# Patient Record
Sex: Male | Born: 1980 | Race: White | Hispanic: Yes | Marital: Married | Smoking: Never smoker
Health system: Southern US, Community
[De-identification: ages and names within clinical notes are randomized; demographics above are authoritative.]

## PROBLEM LIST (undated history)

## (undated) HISTORY — PX: SHOULDER ACROMIOPLASTY: SHX6093

---

## 2015-10-21 ENCOUNTER — Ambulatory Visit (INDEPENDENT_AMBULATORY_CARE_PROVIDER_SITE_OTHER): Payer: 59 | Admitting: Physician Assistant

## 2015-10-21 ENCOUNTER — Ambulatory Visit (INDEPENDENT_AMBULATORY_CARE_PROVIDER_SITE_OTHER): Payer: 59

## 2015-10-21 VITALS — BP 126/80 | HR 70 | Temp 98.0°F | Resp 16 | Ht 63.0 in | Wt 186.2 lb

## 2015-10-21 DIAGNOSIS — S63641A Sprain of metacarpophalangeal joint of right thumb, initial encounter: Secondary | ICD-10-CM

## 2015-10-21 DIAGNOSIS — S5331XA Traumatic rupture of right ulnar collateral ligament, initial encounter: Secondary | ICD-10-CM

## 2015-10-21 DIAGNOSIS — M79641 Pain in right hand: Secondary | ICD-10-CM

## 2015-10-21 MED ORDER — IBUPROFEN 600 MG PO TABS: 600.0000 mg | ORAL_TABLET | Freq: Three times a day (TID) | ORAL | Status: AC | PRN

## 2015-10-21 NOTE — Progress Notes (Signed)
   Subjective:    Patient ID: Eugene Nelson, male    DOB: 02/24/1981, 34 y.o.   MRN: [id number]  HPI Patient presents for right hand pain following injury 2 days ago with machine used to dig holes. Machine twisted and caused thumb to be pushed back. Pain localized between thumb and pointer finger and does not radiate. Denies redness, swelling, or warmth. ROM limited only due to pain, but denies loss of sensation or function. No previous injury to hand. Med allergy amoxicillin.   Review of Systems As noted above.    Objective:   Physical Exam  Constitutional: He is oriented to person, place, and time. He appears well-developed and well-nourished. No distress.  Blood pressure 126/80, pulse 70, temperature 98 F (36.7 C), temperature source Oral, resp. rate 16, height 5\' 3"  (1.6 m), weight 186 lb 3.2 oz (84.46 kg), SpO2 98 %.  HENT:  Head: Normocephalic and atraumatic.  Right Ear: External ear normal.  Left Ear: External ear normal.  Eyes: Conjunctivae are normal. Right eye exhibits no discharge. Left eye exhibits no discharge. No scleral icterus.  Pulmonary/Chest: Effort normal.  Musculoskeletal:       Right wrist: Normal.       Right hand: He exhibits decreased range of motion (secondary to pain) and tenderness. He exhibits no bony tenderness, normal two-point discrimination, normal capillary refill, no deformity, no laceration and no swelling. Normal sensation noted. Decreased strength noted. He exhibits thumb/finger opposition (thumb and pointer finger). He exhibits no finger abduction and no wrist extension trouble.       Hands: Laxity of ulnar collateral ligament of right hand in comparison to left hand.  Neurological: He is alert and oriented to person, place, and time. He has normal reflexes. No cranial nerve deficit. He exhibits normal muscle tone. Coordination normal.  Skin: Skin is warm. No rash noted. He is not diaphoretic. No erythema. No pallor.  Psychiatric: He has a normal  mood and affect. His behavior is normal. Judgment and thought content normal.    [facility] reading (PRIMARY) by  Dr. Dareen PianoAnderson. No acute bony abnomalities.     Assessment & Plan:  1. Pain of right hand 2. Gamekeeper's thumb of right hand, initial encounter RTC in 2 weeks for follow up. Ice and rest. - DG Hand Complete Right; Future - ibuprofen (ADVIL,MOTRIN) 600 MG tablet; Take 1 tablet (600 mg total) by mouth every 8 (eight) hours as needed.  Dispense: 30 tablet; Refill: 0   Aprile Dickenson PA-C  [facility] 10/21/2015 11:52 AM

## 2015-10-21 NOTE — Progress Notes (Signed)
  Medical screening examination/treatment/procedure(s) were performed by non-physician practitioner and as supervising physician I was immediately available for consultation/collaboration.     

## 2015-10-21 NOTE — Patient Instructions (Signed)
Gamekeeper's thumb (or skier's thumb) derives its name from court gamekeepers who developed chronic degeneration of the ulnar collateral ligament of the metacarpophalangeal (MCP) joint when twisting the necks of fowl and other game caught while hunting.  MECHANISM OF INJURY - Forced abduction and hyperextension of the metacarpophalangeal (MCP) joint is the usual mechanism for ulnar collateral ligament (UCL) injury. This can occur if someone falls onto their thumb or the thumb is struck, violently forcing it into abduction.  Acute therapy, splinting, and rehabilitation - Ice should be applied acutely to the metacarpophalangeal (MCP) joint. The nonsurgical treatment of choice is immobilization with a thumb spica splint. The thumb must remain completely rested and protected for at least three weeks, after which gentle passive range of motion exercises may be performed out of the splint along with isometric strengthening of thumb flexion and intrinsic muscles of the hand (pinch and handgrip).

## 2016-12-12 IMAGING — CR DG HAND COMPLETE 3+V*R*
3 series · 3 of 3 positions shown · non-contrast
Comparison: None.

CLINICAL DATA: Right hand pain since an injury 2 days ago while
taking holes with a machine resulted hyperextension of the right
thumb. Initial encounter.

EXAM:
RIGHT HAND - COMPLETE 3+ VIEW

[PA]
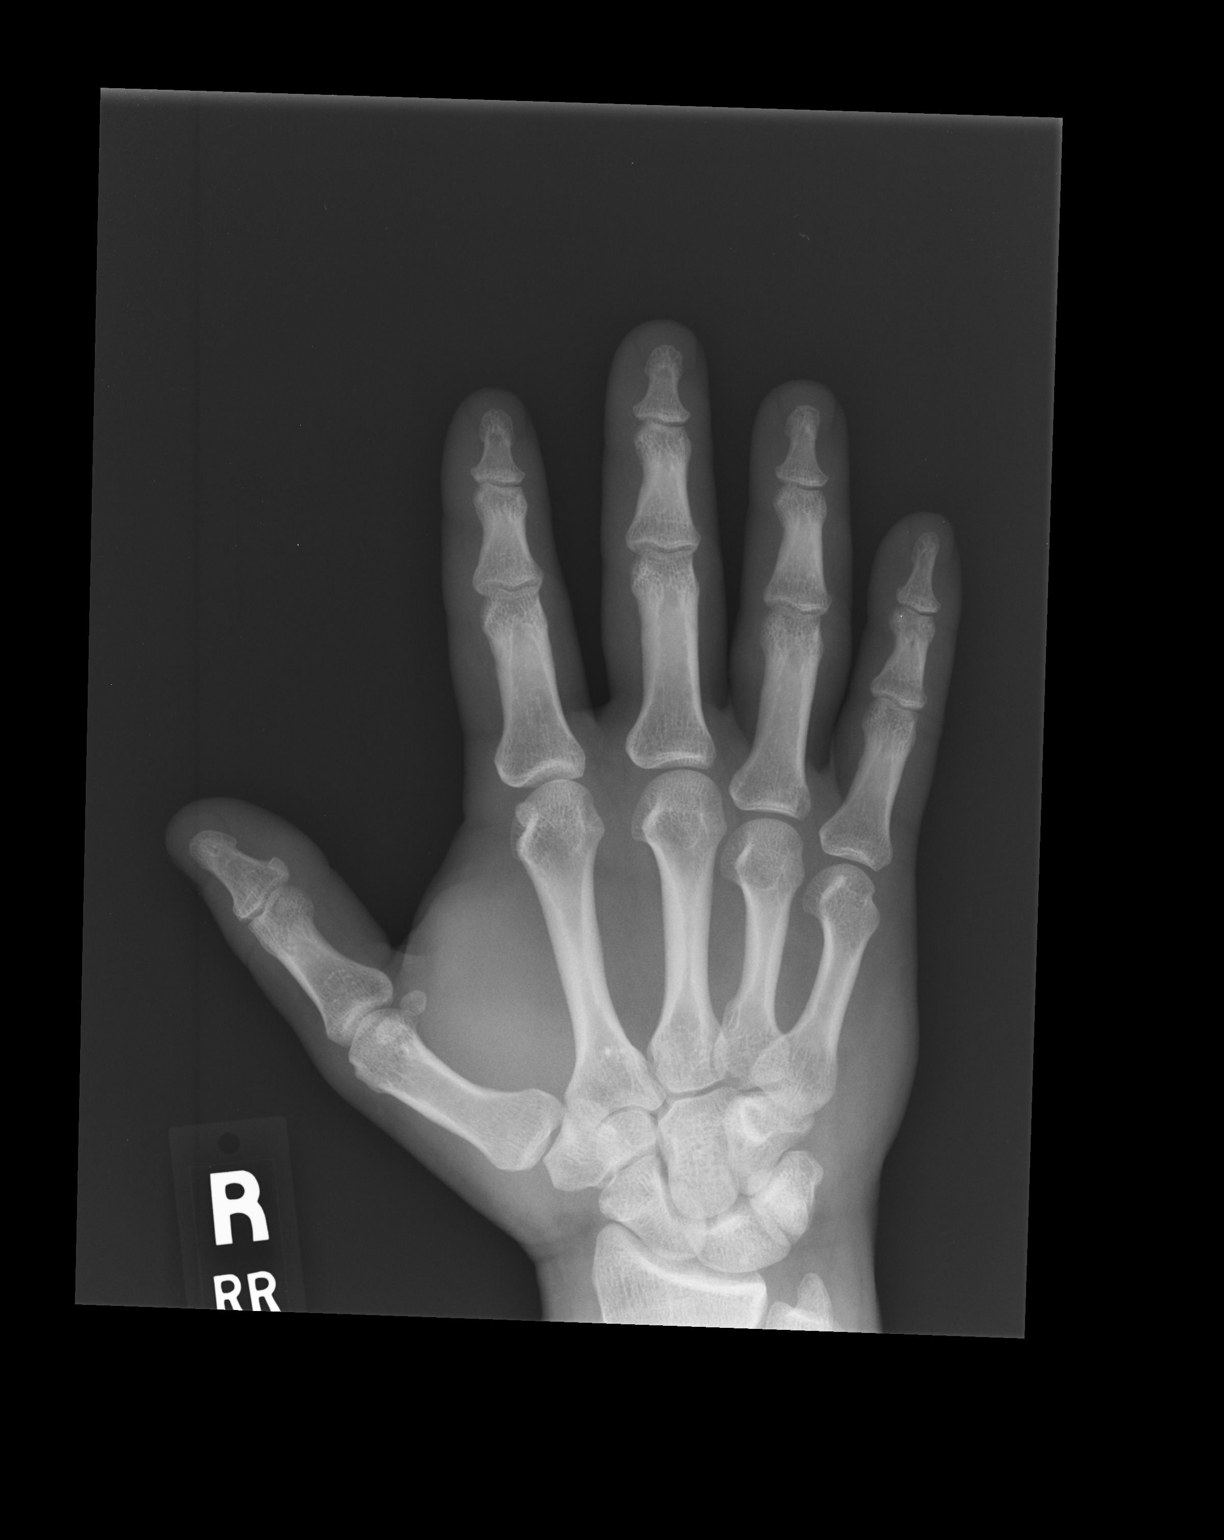

[lateral]
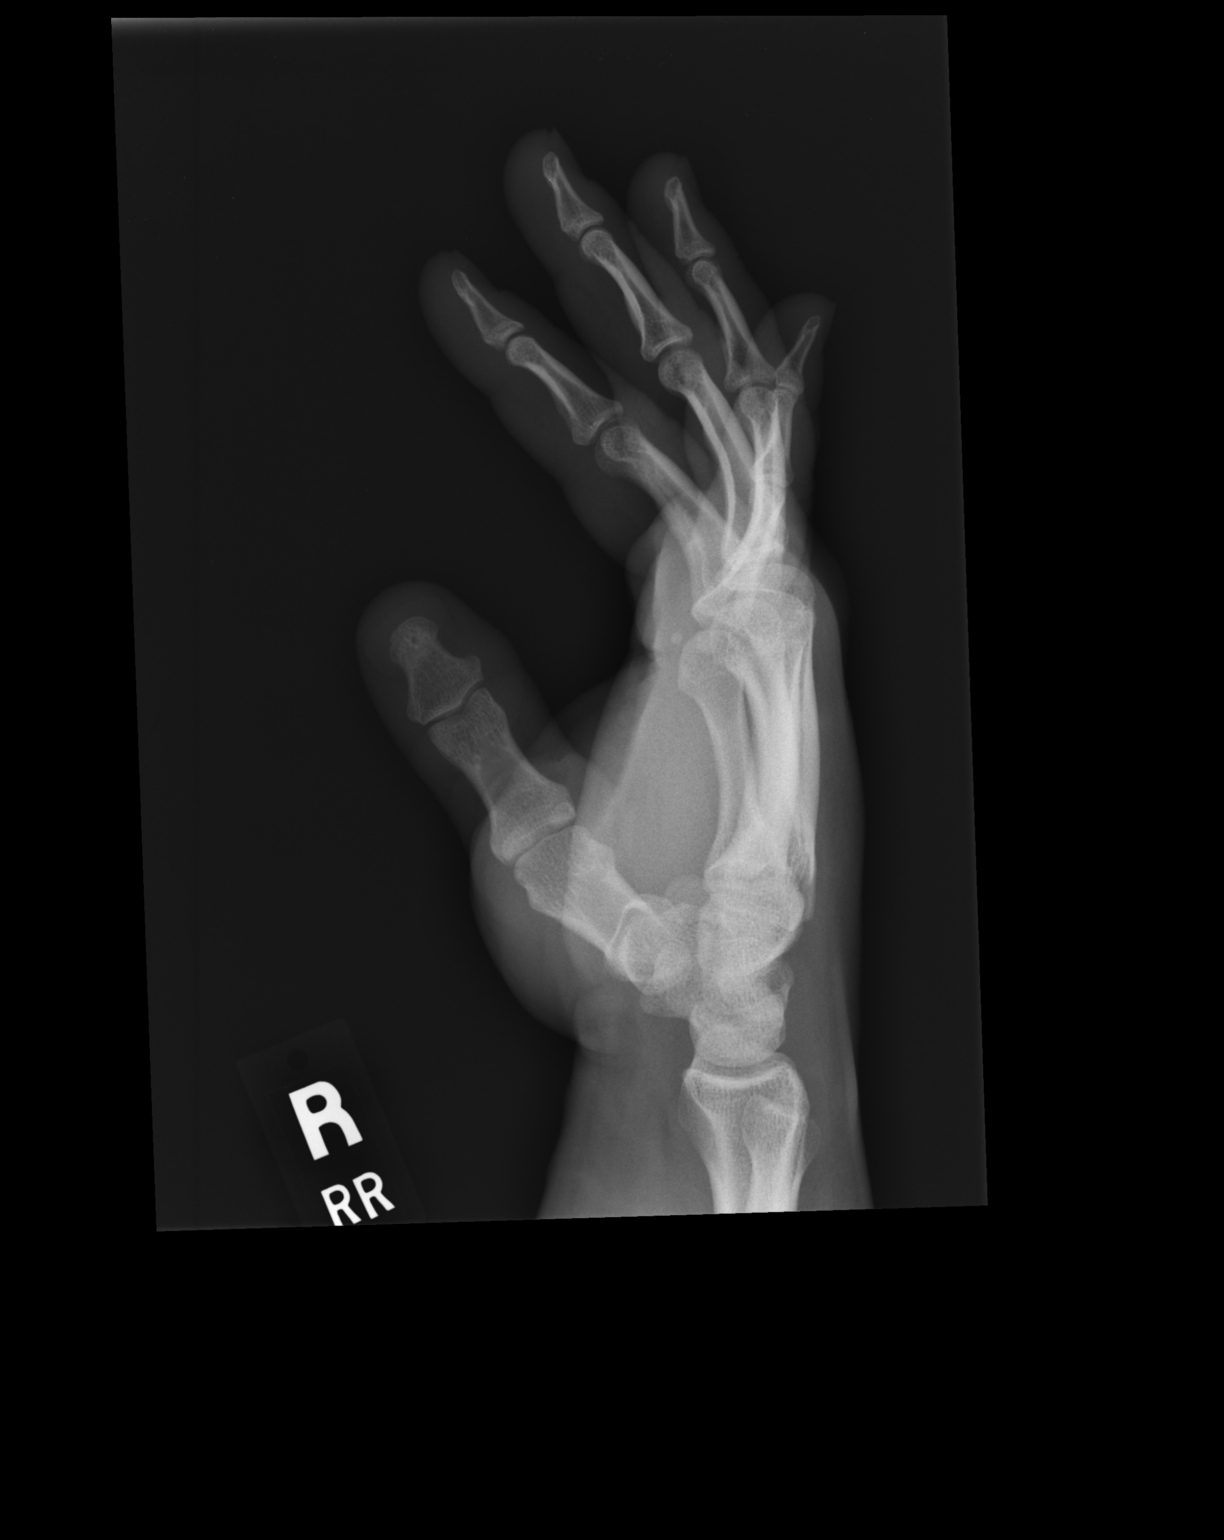

[pa obl]
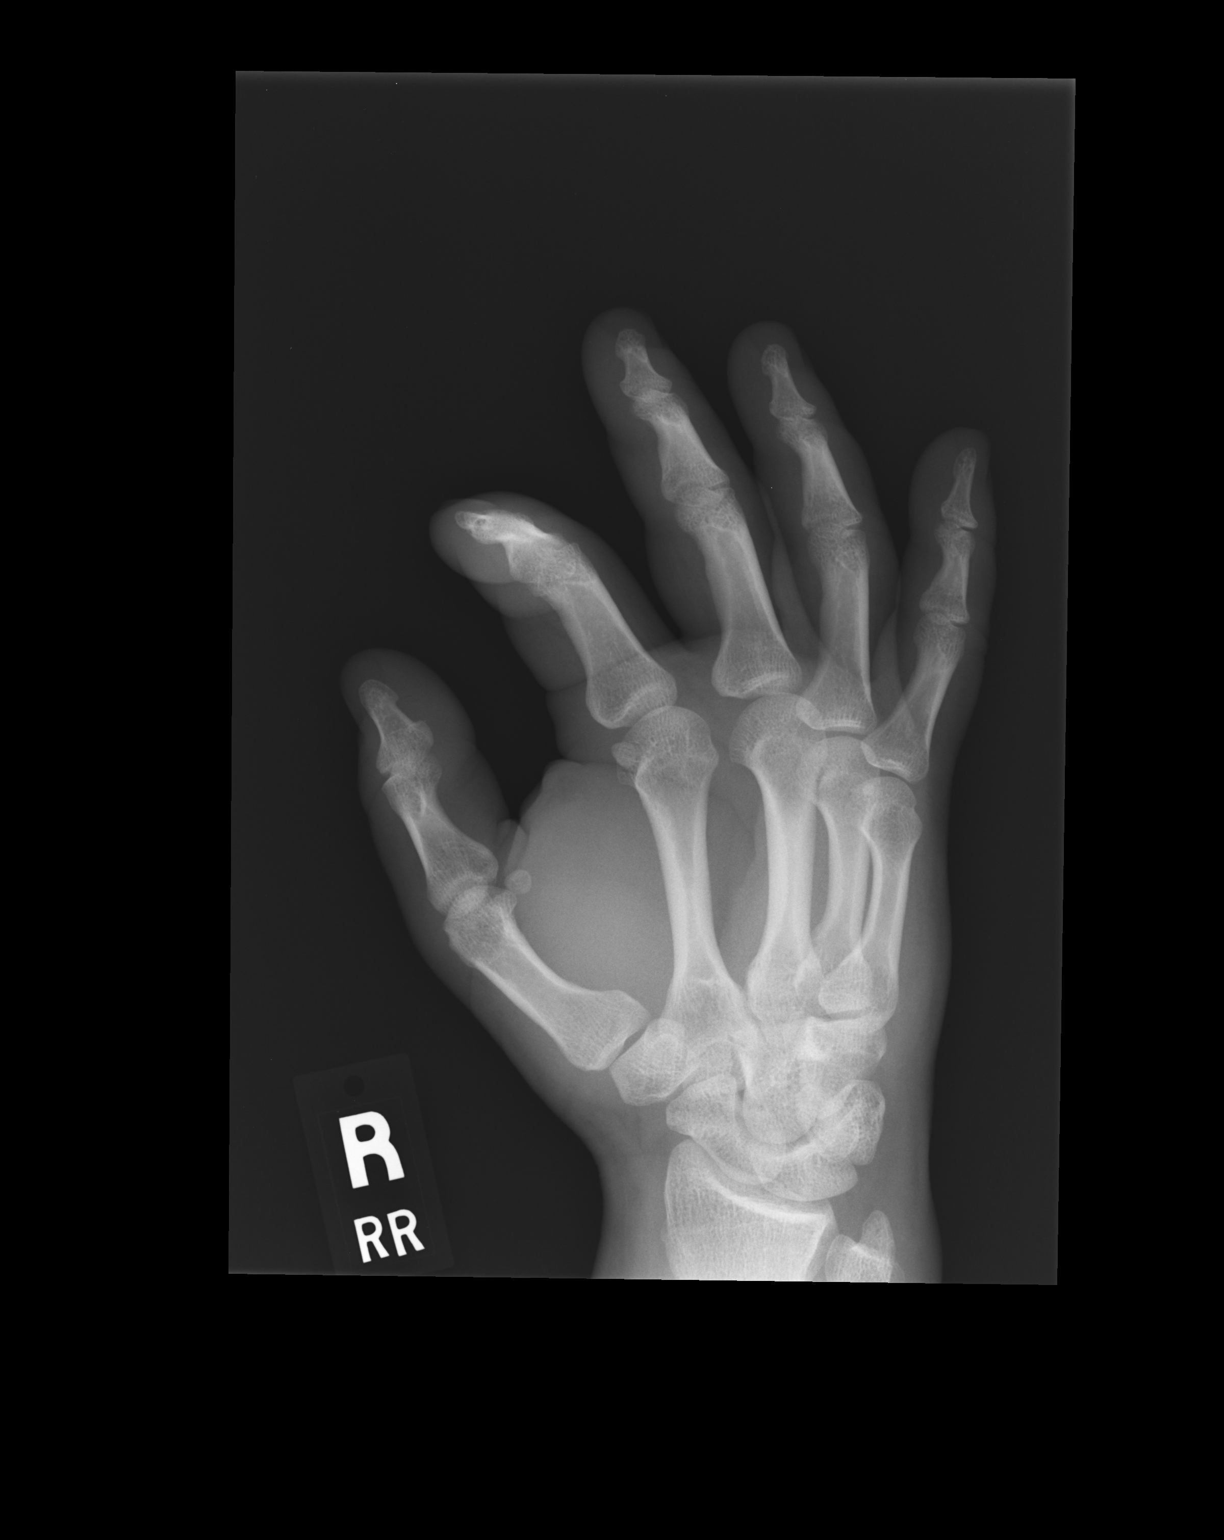

[3 of 3 positions shown; findings below may reference images not displayed]

FINDINGS: There is no evidence of fracture or dislocation. There is no
evidence of arthropathy or other focal bone abnormality. Soft
tissues are unremarkable.
IMPRESSION: Negative exam.

## 2020-03-31 ENCOUNTER — Other Ambulatory Visit: Payer: Self-pay

## 2020-03-31 ENCOUNTER — Emergency Department (INDEPENDENT_AMBULATORY_CARE_PROVIDER_SITE_OTHER)
Admission: EM | Admit: 2020-03-31 | Discharge: 2020-03-31 | Disposition: A | Discharge status: Home / Self Care | Payer: 59 | Source: Home / Self Care | Attending: Family Medicine | Admitting: Family Medicine

## 2020-03-31 DIAGNOSIS — S61215A Laceration without foreign body of left ring finger without damage to nail, initial encounter: Secondary | ICD-10-CM

## 2020-03-31 DIAGNOSIS — Z23 Encounter for immunization: Secondary | ICD-10-CM | POA: Diagnosis not present

## 2020-03-31 DIAGNOSIS — S61217A Laceration without foreign body of left little finger without damage to nail, initial encounter: Secondary | ICD-10-CM | POA: Diagnosis not present

## 2020-03-31 MED ORDER — DOXYCYCLINE HYCLATE 100 MG PO CAPS: 100.0000 mg | ORAL_CAPSULE | Freq: Two times a day (BID) | ORAL | 0 refills | Status: AC

## 2020-03-31 MED ORDER — TETANUS-DIPHTH-ACELL PERTUSSIS 5-2.5-18.5 LF-MCG/0.5 IM SUSP
0.5000 mL | Freq: Once | INTRAMUSCULAR | Status: AC
  Administered 2020-03-31: 0.5 mL via INTRAMUSCULAR

## 2020-03-31 NOTE — ED Provider Notes (Signed)
[facility]    CSN: [id number] Arrival date & time: 03/31/20  1858      History   Chief Complaint Chief Complaint  Patient presents with  . Extremity Laceration    Multiple, fingers    HPI Eugene Nelson is a 39 y.o. male.   About nine hours ago patient lacerated two fingers of his left hand on a metal edge while lifting a refrigerator.  He does not remember his last Tdap.  The history is provided by the patient.  Laceration Location:  Finger Finger laceration location:  L little finger and L ring finger Length:  1.5cm and 106mm Depth:  Through dermis Quality: stellate   Bleeding: controlled   Time since incident:  9 hours Laceration mechanism:  Metal edge Pain details:    Quality:  Aching   Severity:  Mild   Timing:  Constant   Progression:  Improving Relieved by:  Nothing Worsened by:  Movement Ineffective treatments:  None tried Tetanus status:  Out of date Associated symptoms: no numbness and no swelling     History reviewed. No pertinent past medical history.  There are no problems to display for this patient.   Past Surgical History:  Procedure Laterality Date  . SHOULDER ACROMIOPLASTY         Home Medications    Prior to Admission medications   Medication Sig Start Date End Date Taking? Authorizing Provider  ibuprofen (ADVIL,MOTRIN) 600 MG tablet Take 1 tablet (600 mg total) by mouth every 8 (eight) hours as needed. 10/21/15   [place], Jake Seats, PA-C    Family History Family History  Problem Relation Age of Onset  . Healthy Mother   . Hypertension Father     Social History Social History   Tobacco Use  . Smoking status: Never Smoker  . Smokeless tobacco: Never Used  Substance Use Topics  . Alcohol use: Yes    Alcohol/week: 4.0 standard drinks    Types: 4 Standard drinks or equivalent per week    Comment: weekly  . Drug use: No     Allergies   Amoxicillin   Review of Systems Review of Systems  Skin:  Positive for wound.  All other systems reviewed and are negative.    Physical Exam Triage Vital Signs ED Triage Vitals  Enc Vitals Group     BP 03/31/20 1917 135/88     Pulse Rate 03/31/20 1917 (!) 52     Resp 03/31/20 1917 16     Temp 03/31/20 1917 98.1 F (36.7 C)     Temp Source 03/31/20 1917 Oral     SpO2 03/31/20 1917 100 %     Weight --      Height --      Head Circumference --      Peak Flow --      Pain Score 03/31/20 1915 3     Pain Loc --      Pain Edu? --      Excl. in [place]? --    No data found.  Updated Vital Signs BP 135/88 (BP Location: Right Arm)   Pulse (!) 52   Temp 98.1 F (36.7 C) (Oral)   Resp 16   SpO2 100%   Visual Acuity Right Eye Distance:   Left Eye Distance:   Bilateral Distance:    Right Eye Near:   Left Eye Near:    Bilateral Near:     Physical Exam Vitals and nursing note reviewed.  Constitutional:  General: He is not in acute distress. HENT:     Head: Normocephalic.  Eyes:     Pupils: Pupils are equal, round, and reactive to light.  Cardiovascular:     Rate and Rhythm: Normal rate.  Musculoskeletal:       Hands:     Comments: Left fifth finger has a stellate 1.5cm laceration on volar surface of middle phalanx. Left fourth finger has an 38mm flap laceration on volar surface of middle phalanx. Fingers have full range of motion.  Distal neurovascular function is intact.   Skin:    General: Skin is dry.  Neurological:     Mental Status: He is alert.      UC Treatments / Results  Labs (all labs ordered are listed, but only abnormal results are displayed) Labs Reviewed - No data to display  EKG   Radiology No results found.  Procedures Procedures  Laceration Repair Discussed benefits and risks of procedure and verbal consent obtained. Using sterile technique and digital anesthesia with 1% lidocaine without epinephrine, cleansed wounds with Betadine followed by copious lavage with normal saline.  Wounds  carefully inspected for debris and foreign bodies; none found.  Wound on fifth finger closed with #6, 5-0 interrupted nylon sutures. Wound on fourth finger closed with #3, 5-0 interrupted nylon sutures. Bacitracin and non-stick sterile dressing applied.  Wound precautions explained to patient.  Return for suture removal in 10 days.   Medications Ordered in UC Medications - No data to display  Initial Impression / Assessment and Plan / UC Course  I have reviewed the triage vital signs and the nursing notes.  Pertinent labs & imaging results that were available during my care of the patient were reviewed by me and considered in my medical decision making (see chart for details).    Administered Tdap    Final Clinical Impressions(s) / UC Diagnoses   Final diagnoses:  Laceration of left little finger without foreign body without damage to nail, initial encounter  Laceration of left ring finger without foreign body without damage to nail, initial encounter     Discharge Instructions     Change dressing daily and apply Bacitracin ointment to wounds.  Keep wounds clean and dry.  Return for any signs of infection (or follow-up with family doctor):  Increasing redness, swelling, pain, heat, drainage, etc. Return in 10 days for suture removal.      ED Prescriptions    None        Lattie Haw, MD 04/01/20 1150

## 2020-03-31 NOTE — ED Triage Notes (Signed)
Patient presents to Urgent Care with complaints of multiple small lacerations on his left hand since earlier today. Patient reports he cut them on some sheet metal while moving a refrigerator. Unknown tetanus.

## 2020-03-31 NOTE — Discharge Instructions (Addendum)
Change dressing daily and apply Bacitracin ointment to wounds.  Keep wounds clean and dry.  Return for any signs of infection (or follow-up with family doctor):  Increasing redness, swelling, pain, heat, drainage, etc. Return in 10 days for suture removal.
